# Patient Record
Sex: Male | Born: 2005 | Race: Black or African American | Hispanic: No | Marital: Single | Smoking: Never smoker
Health system: Southern US, Community
[De-identification: ages and names within clinical notes are randomized; demographics above are authoritative.]

## PROBLEM LIST (undated history)

## (undated) ENCOUNTER — Ambulatory Visit: Payer: 59

## (undated) DIAGNOSIS — R062 Wheezing: Secondary | ICD-10-CM

---
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
## 2023-01-25 ENCOUNTER — Ambulatory Visit: Payer: 59
